# Patient Record
Sex: Male | Born: 1995 | Race: White | Hispanic: No | Marital: Single | Smoking: Former smoker
Health system: Southern US, Community
[De-identification: ages and names within clinical notes are randomized; demographics above are authoritative.]

## PROBLEM LIST (undated history)

## (undated) ENCOUNTER — Emergency Department (HOSPITAL_COMMUNITY): Disposition: A | Discharge status: Home / Self Care | Payer: Self-pay

## (undated) DIAGNOSIS — F419 Anxiety disorder, unspecified: Secondary | ICD-10-CM

## (undated) DIAGNOSIS — K219 Gastro-esophageal reflux disease without esophagitis: Secondary | ICD-10-CM

## (undated) DIAGNOSIS — T7840XA Allergy, unspecified, initial encounter: Secondary | ICD-10-CM

## (undated) HISTORY — DX: Gastro-esophageal reflux disease without esophagitis: K21.9

## (undated) HISTORY — PX: SHOULDER SURGERY: SHX246

## (undated) HISTORY — PX: TONSILLECTOMY: SUR1361

## (undated) HISTORY — DX: Anxiety disorder, unspecified: F41.9

## (undated) HISTORY — DX: Allergy, unspecified, initial encounter: T78.40XA

---
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
## 2014-11-20 ENCOUNTER — Ambulatory Visit: Payer: Self-pay

## 2014-11-20 ENCOUNTER — Other Ambulatory Visit: Payer: Self-pay | Admitting: Occupational Medicine

## 2014-11-20 DIAGNOSIS — M79644 Pain in right finger(s): Secondary | ICD-10-CM

## 2017-03-06 ENCOUNTER — Emergency Department (HOSPITAL_COMMUNITY)
Admission: EM | Admit: 2017-03-06 | Discharge: 2017-03-06 | Disposition: A | Discharge status: Home / Self Care | Payer: 59 | Attending: Emergency Medicine | Admitting: Emergency Medicine

## 2017-03-06 ENCOUNTER — Encounter (HOSPITAL_COMMUNITY): Payer: Self-pay | Admitting: Emergency Medicine

## 2017-03-06 ENCOUNTER — Emergency Department (HOSPITAL_COMMUNITY): Payer: 59

## 2017-03-06 DIAGNOSIS — J189 Pneumonia, unspecified organism: Secondary | ICD-10-CM | POA: Insufficient documentation

## 2017-03-06 DIAGNOSIS — R112 Nausea with vomiting, unspecified: Secondary | ICD-10-CM | POA: Insufficient documentation

## 2017-03-06 DIAGNOSIS — R197 Diarrhea, unspecified: Secondary | ICD-10-CM | POA: Diagnosis not present

## 2017-03-06 DIAGNOSIS — F17210 Nicotine dependence, cigarettes, uncomplicated: Secondary | ICD-10-CM | POA: Insufficient documentation

## 2017-03-06 LAB — COMPREHENSIVE METABOLIC PANEL
ALT: 38 U/L (ref 17–63)
ANION GAP: 10 (ref 5–15)
AST: 44 U/L — ABNORMAL HIGH (ref 15–41)
Albumin: 4 g/dL (ref 3.5–5.0)
Alkaline Phosphatase: 44 U/L (ref 38–126)
BUN: 9 mg/dL (ref 6–20)
CHLORIDE: 99 mmol/L — AB (ref 101–111)
CO2: 24 mmol/L (ref 22–32)
CREATININE: 1.01 mg/dL (ref 0.61–1.24)
Calcium: 8.8 mg/dL — ABNORMAL LOW (ref 8.9–10.3)
Glucose, Bld: 101 mg/dL — ABNORMAL HIGH (ref 65–99)
POTASSIUM: 2.9 mmol/L — AB (ref 3.5–5.1)
SODIUM: 133 mmol/L — AB (ref 135–145)
Total Bilirubin: 0.9 mg/dL (ref 0.3–1.2)
Total Protein: 7 g/dL (ref 6.5–8.1)

## 2017-03-06 LAB — CBC WITH DIFFERENTIAL/PLATELET
Basophils Absolute: 0 10*3/uL (ref 0.0–0.1)
Basophils Relative: 0 %
EOS PCT: 0 %
Eosinophils Absolute: 0 10*3/uL (ref 0.0–0.7)
HEMATOCRIT: 40.1 % (ref 39.0–52.0)
HEMOGLOBIN: 14.5 g/dL (ref 13.0–17.0)
LYMPHS ABS: 1.7 10*3/uL (ref 0.7–4.0)
LYMPHS PCT: 15 %
MCH: 29.9 pg (ref 26.0–34.0)
MCHC: 36.2 g/dL — ABNORMAL HIGH (ref 30.0–36.0)
MCV: 82.7 fL (ref 78.0–100.0)
Monocytes Absolute: 0.4 10*3/uL (ref 0.1–1.0)
Monocytes Relative: 4 %
NEUTROS ABS: 9.3 10*3/uL — AB (ref 1.7–7.7)
NEUTROS PCT: 81 %
PLATELETS: 203 10*3/uL (ref 150–400)
RBC: 4.85 MIL/uL (ref 4.22–5.81)
RDW: 12.6 % (ref 11.5–15.5)
WBC: 11.5 10*3/uL — AB (ref 4.0–10.5)

## 2017-03-06 LAB — LIPASE, BLOOD: LIPASE: 27 U/L (ref 11–51)

## 2017-03-06 LAB — MAGNESIUM: Magnesium: 1.6 mg/dL — ABNORMAL LOW (ref 1.7–2.4)

## 2017-03-06 LAB — RAPID STREP SCREEN (MED CTR MEBANE ONLY): Streptococcus, Group A Screen (Direct): NEGATIVE

## 2017-03-06 MED ORDER — ONDANSETRON HCL 4 MG PO TABS: 4.0000 mg | ORAL_TABLET | Freq: Four times a day (QID) | ORAL | 0 refills | Status: DC

## 2017-03-06 MED ORDER — AZITHROMYCIN 250 MG PO TABS
500.0000 mg | ORAL_TABLET | Freq: Once | ORAL | Status: AC
  Administered 2017-03-06: 500 mg via ORAL
  Filled 2017-03-06: qty 2

## 2017-03-06 MED ORDER — SODIUM CHLORIDE 0.9 % IV BOLUS (SEPSIS)
1000.0000 mL | Freq: Once | INTRAVENOUS | Status: AC
  Administered 2017-03-06: 1000 mL via INTRAVENOUS

## 2017-03-06 MED ORDER — POTASSIUM CHLORIDE 10 MEQ/100ML IV SOLN
10.0000 meq | Freq: Once | INTRAVENOUS | Status: AC
  Administered 2017-03-06: 10 meq via INTRAVENOUS
  Filled 2017-03-06: qty 100

## 2017-03-06 MED ORDER — POTASSIUM CHLORIDE CRYS ER 20 MEQ PO TBCR: 20.0000 meq | EXTENDED_RELEASE_TABLET | Freq: Two times a day (BID) | ORAL | 0 refills | Status: DC

## 2017-03-06 MED ORDER — AZITHROMYCIN 250 MG PO TABS: 250.0000 mg | ORAL_TABLET | Freq: Every day | ORAL | 0 refills | Status: AC

## 2017-03-06 MED ORDER — ONDANSETRON HCL 4 MG/2ML IJ SOLN
4.0000 mg | Freq: Once | INTRAMUSCULAR | Status: AC
  Administered 2017-03-06: 4 mg via INTRAVENOUS
  Filled 2017-03-06: qty 2

## 2017-03-06 MED ORDER — SUCRALFATE 1 G PO TABS: 1.0000 g | ORAL_TABLET | Freq: Three times a day (TID) | ORAL | 0 refills | Status: DC

## 2017-03-06 MED ORDER — PANTOPRAZOLE SODIUM 20 MG PO TBEC: 20.0000 mg | DELAYED_RELEASE_TABLET | Freq: Every day | ORAL | 0 refills | Status: DC

## 2017-03-06 MED ORDER — MAGNESIUM SULFATE IN D5W 1-5 GM/100ML-% IV SOLN
1.0000 g | Freq: Once | INTRAVENOUS | Status: AC
  Administered 2017-03-06: 1 g via INTRAVENOUS
  Filled 2017-03-06: qty 100

## 2017-03-06 MED ORDER — POTASSIUM CHLORIDE CRYS ER 20 MEQ PO TBCR
40.0000 meq | EXTENDED_RELEASE_TABLET | Freq: Once | ORAL | Status: AC
Start: 2017-03-06 — End: 2017-03-06
  Administered 2017-03-06: 40 meq via ORAL
  Filled 2017-03-06: qty 2

## 2017-03-06 NOTE — Discharge Instructions (Signed)
Please read and follow all provided instructions.  Your diagnoses today include:  1. Community acquired pneumonia, unspecified laterality   2. Non-intractable vomiting with nausea, unspecified vomiting type   3. Diarrhea, unspecified type     Tests performed today include: Blood counts and electrolytes Blood tests to check liver and kidney function Blood tests to check pancreas function Chest x-ray.  This showed you had a community-acquired pneumonia in both upper lobes. Vital signs. See below for your results today.   Medications prescribed:   Take any prescribed medications only as directed.  Please take all of your antibiotics until finished.   You may develop abdominal discomfort or nausea from the antibiotic. If this occurs, you may take it with food. Some patients also get diarrhea with antibiotics. You may help offset this with probiotics which you can buy or get in yogurt. Do not eat or take the probiotics until 2 hours after your antibiotic.  Some people develop allergies to antibiotics. Symptoms of antibiotic allergy can be mild and include a flat rash and itching. They can also be more serious and include:  ?Hives - Hives are raised, red patches of skin that are usually very itchy.  ?Lip or tongue swelling  ?Trouble swallowing or breathing  ?Blistering of the skin or mouth.  If you have any of these serious symptoms, please seek emergency medical care immediately.  I prescribed a proton pump inhibitor to help reduce acid in your stomach.  This will help treat any irritation your stomach.  I also prescribed Carafate, this is a medication that carries a coating of your stomach to prevent irritation.  Zofran is for nausea.  You may take 1 pill every 6 hours as needed.  Please ensure that you take all of your potassium to bring your potassium levels up.   Home care instructions:  Follow any educational materials contained in this packet.  Your abdominal pain, nausea,  vomiting, and diarrhea may be caused by a viral gastroenteritis also called 'stomach flu'. You should rest for the next several days. Keep drinking plenty of fluids and use the medicine for nausea as directed.   Drink clear liquids for the next 24 hours and introduce solid foods slowly after 24 hours using the b.r.a.t. diet (Bananas, Rice, Applesauce, Toast, Yogurt).    On testing today, your potassium level was 2.9 which is slightly below normal. I suspect this is due to vomiting. Increasing potassium in your diet should be sufficient to make sure you are getting enough potassium. Below is a list of good sources and servings high in potassium (in order from greatest to least):  -1 cup cooked acorn squash -1 baked potato with skin -1 cup cooked spinach -1 cup cooked lentils -1 cup cooked kidney beans -Watermelon - cup raisins -1 cup plain yogurt -1 cup orange juice, frozen -Banana -1 cup 1% low-fat milk -1 cup cooked broccoli   Follow-up instructions: Please follow-up with your primary care provider in the next 2 days for further evaluation of your symptoms. If you are not feeling better in 48 hours you may have a condition that is more serious and you need re-evaluation.   You should receive a repeat chest x-ray in 4 weeks by establishing primary care.  I provided some resources in this paperwork to get a primary care provider.  Return instructions:  SEEK IMMEDIATE MEDICAL ATTENTION IF: If you have pain that does not go away or becomes severe  A temperature above 101F develops  Repeated  vomiting occurs (multiple episodes)  If you have pain that becomes localized to portions of the abdomen. The right side could possibly be appendicitis. In an adult, the left lower portion of the abdomen could be colitis or diverticulitis.  Blood is being passed in stools or vomit (bright red or black tarry stools)  You develop chest pain, difficulty breathing, dizziness or fainting, or become  confused, poorly responsive, or inconsolable (young children) If you have any other emergent concerns regarding your health Please return for any increasing chest pain or shortness of breath.  Additional Information: Abdominal (belly) pain can be caused by many things. Your caregiver performed an examination and possibly ordered blood/urine tests and imaging (CT scan, x-rays, ultrasound). Many cases can be observed and treated at home after initial evaluation in the emergency department. Even though you are being discharged home, abdominal pain can be unpredictable. Therefore, you need a repeated exam if your pain does not resolve, returns, or worsens. Most patients with abdominal pain don't have to be admitted to the hospital or have surgery, but serious problems like appendicitis and gallbladder attacks can start out as nonspecific pain. Many abdominal conditions cannot be diagnosed in one visit, so follow-up evaluations are very important.  To find a primary care or specialty doctor please call [phone] or [phone] to access "[facility] Find a Doctor Service."  You may also go on the [facility] website at [email or web address]  There are also multiple [facility] and [facility] practices throughout the [place] that are frequently accepting new patients. You may find a clinic that is close to your home and contact them.  [facility] - [address] [place] [phone]  [facility] in [place] (also locations in [place] and [place]) - [address] [place] [phone]  [facility] - [address] [place] [phone]    Your vital signs today were: BP 138/77 (BP Location: Right Arm)    Pulse 96    Temp 99.5 F (37.5 C) (Oral)    Resp 16    SpO2 96%  If your blood pressure (bp) was elevated above 135/85 this visit, please have this repeated by  your doctor within one month. --------------

## 2017-03-06 NOTE — ED Provider Notes (Signed)
[facility]-EMERGENCY DEPT Provider Note   CSN: [id number] Arrival date & time: 03/06/17  1002     History   Chief Complaint Chief Complaint  Patient presents with  . Fatigue  . flu like symptoms    HPI Harold Hudson is a 22 y.o. male.  HPI   Patient is a 22 year old male with no significant past medical history and no abdominal surgical history presenting for nausea, vomiting, diarrhea.  Patient reports his symptoms have occurred for the past 4-5 days, and he is unable to keep anything by mouth down.  Patient reports that he will vomit up to 10 times a day, as well as have diarrheal episodes up to 10 times a day.  Patient also reports an increased cough.  Patient reports that he has seen brown tinged sputum and emesis, as well as some bright red blood mixed in his emesis.  No melena or hematochezia.  Patient reports he has felt chilled, however is not recorded any fevers at home.  Patient reports that he has some diffuse discomfort to the abdomen.  Additionally, patient reports that he has a sore throat that has been swallowing painful.  No obstructive swallowing, or change in voice quality.  Patient has been taking Mucinex as well as Tylenol for symptoms.  Patient denies NSAID use.  Patient reports that he does drink alcohol heavily weekly.  Patient reports that on weekdays, he can drink up to 3 beers in an evening, approximately 3x per week, and on weekends he may have up to 8 beers.  Patient does smoke cigarettes.  History reviewed. No pertinent past medical history.  There are no active problems to display for this patient.   History reviewed. No pertinent surgical history.     Home Medications    Prior to Admission medications   Not on File    Family History History reviewed. No pertinent family history.  Social History Social History   Tobacco Use  . Smoking status: Current Every Day Smoker  . Smokeless tobacco: Never Used  Substance Use  Topics  . Alcohol use: Yes  . Drug use: Yes    Types: Marijuana     Allergies   Patient has no known allergies.   Review of Systems Review of Systems  Constitutional: Positive for chills and fever.  HENT: Positive for congestion, rhinorrhea and sore throat. Negative for voice change.   Eyes: Negative for visual disturbance.  Respiratory: Positive for cough.   Cardiovascular: Negative for chest pain.  Gastrointestinal: Positive for abdominal pain. Negative for blood in stool.  Genitourinary: Negative for dysuria.  Musculoskeletal: Negative for neck pain and neck stiffness.  Skin: Negative for rash.  Allergic/Immunologic: Negative for immunocompromised state.  Neurological: Positive for headaches.     Physical Exam Updated Vital Signs BP 139/83 (BP Location: Right Arm)   Pulse 100   Temp 100.1 F (37.8 C) (Oral)   Resp 18   SpO2 100%   Physical Exam  Constitutional: He appears well-developed and well-nourished. No distress.  HENT:  Head: Normocephalic and atraumatic.  Mouth/Throat: Oropharynx is clear and moist.  Normal phonation. No muffled voice sounds. Patient swallows secretions without difficulty. Dentition normal. No lesions of tongue or buccal mucosa. Uvula midline. No asymmetric swelling of the posterior pharynx. No erythema of posterior pharynx.  Tonsils surgically removed.  No lingual swelling. No induration inferior to tongue. No submandibular tenderness, swelling, or induration.  Tissues of the neck supple. No cervical lymphadenopathy.   Eyes: Conjunctivae  and EOM are normal. Pupils are equal, round, and reactive to light.  Neck: Normal range of motion. Neck supple.  No neck stiffness.  No meningismal signs.  Cardiovascular: Normal rate, regular rhythm, S1 normal and S2 normal.  No murmur heard. Patient non tachycardic.  Intact, 2+ radial pulse.  Pulmonary/Chest: Effort normal and breath sounds normal. He has no wheezes.  Coarse lung sounds on right lower  lung field.  Abdominal: Soft. He exhibits no distension. There is no tenderness. There is no guarding.  Abdomen benign.  Musculoskeletal: Normal range of motion. He exhibits no edema or deformity.  Lymphadenopathy:    He has no cervical adenopathy.  Neurological: He is alert.  Cranial nerves grossly intact. Patient was extremities symmetrically and with good coordination.  Skin: Skin is warm and dry. No rash noted. No erythema.  Psychiatric: He has a normal mood and affect. His behavior is normal. Judgment and thought content normal.  Nursing note and vitals reviewed.    ED Treatments / Results  Labs (all labs ordered are listed, but only abnormal results are displayed) Labs Reviewed  RAPID STREP SCREEN (NOT AT [facility])  COMPREHENSIVE METABOLIC PANEL  LIPASE, BLOOD  CBC WITH DIFFERENTIAL/PLATELET    EKG  EKG Interpretation None       Radiology No results found.  Procedures Procedures (including critical care time)  Medications Ordered in ED Medications  sodium chloride 0.9 % bolus 1,000 mL (not administered)  ondansetron (ZOFRAN) injection 4 mg (not administered)     Initial Impression / Assessment and Plan / ED Course  I have reviewed the triage vital signs and the nursing notes.  Pertinent labs & imaging results that were available during my care of the patient were reviewed by me and considered in my medical decision making (see chart for details).     Final Clinical Impressions(s) / ED Diagnoses   Final diagnoses:  Community acquired pneumonia, unspecified laterality  Non-intractable vomiting with nausea, unspecified vomiting type  Diarrhea, unspecified type   Patient is nontoxic-appearing, temperature 100.1, and in no acute distress at this time.  Patient is having ongoing nausea as well as emesis and diarrhea.  No emesis observed in the room.  Will initiate CMP, lipase, CBC with differential, chest x-ray, and rapid strep.  Will initiate fluid repletion  and antiemetics and re-eval.  Will assess stability of possible GI bleeding after lab work.   On subsequent reevaluation, patient was asymptomatic of nausea after Zofran.  Patient eating sandwich and tolerating p.o. intake.  I discussed the results of testing with patient.  Patient to receive 1 round of IV potassium due to potassium of 2.9 as well as 40 mEq orally.  Additionally, patient will receive 1 g of magnesium.  EKG ordered due to hypo-kalemia, and without evidence of QT prolongation.  As patient is currently tolerating p.o. intake, expect patient to tolerate outpatient management of community-acquired pneumonia and nausea and vomiting. Case was discussed with Dr. Arby Barrette who is in agreement with plan of care.  Orthostatic vital signs without evidence of significant orthostasis.  Patient is hemodynamically stable and BUN is not elevated.  I do not suspect upper GI bleed.  Chest x-ray was reviewed with Dr. Mancel Bale.  No evidence of cavitary lesions c/w Staph aureus, or concerning features not amenable to outpatient therapy with Zithromax.  Patient clinically well and nontoxic appearing.  Patient discharged with continuation of course of Zithromax, potassium replacement, antiemetics, as well as PPI and Carafate therapy.  Unclear  if patient had blood-tinged emesis.  I recommended follow-up with gastroenterology.  I also provided patient recommendations for primary care, and reviewed this with both patient and his father.  Return precautions were given for any worsening chest pain, shortness of breath, hematemesis or hemoptysis, or intractable nausea or vomiting.  Patient and his father in understanding and agree with the plan of care.  ED Discharge Orders    None       Delia Chimes 03/07/17 0303    Arby Barrette, MD 03/18/17 1407

## 2017-03-06 NOTE — ED Triage Notes (Addendum)
Pt c/o flu like symptoms since Jan. 1, 2019. Pt states worsening over the past several days. Pt has taken Tylenol 500mg  and Mucinex this morning. Pt states he has not been able to eat due to nausea.

## 2017-03-06 NOTE — ED Notes (Signed)
Pt ambulatory and independent at discharge.  Verbalized understanding of discharge instructions 

## 2017-03-09 LAB — CULTURE, GROUP A STREP (THRC)

## 2019-03-15 IMAGING — CR DG CHEST 2V
2 series · 2 of 2 positions shown · non-contrast
Comparison: None.

CLINICAL DATA: Hemoptysis, fever for 2 days

EXAM:
CHEST  2 VIEW

[w chest pa]
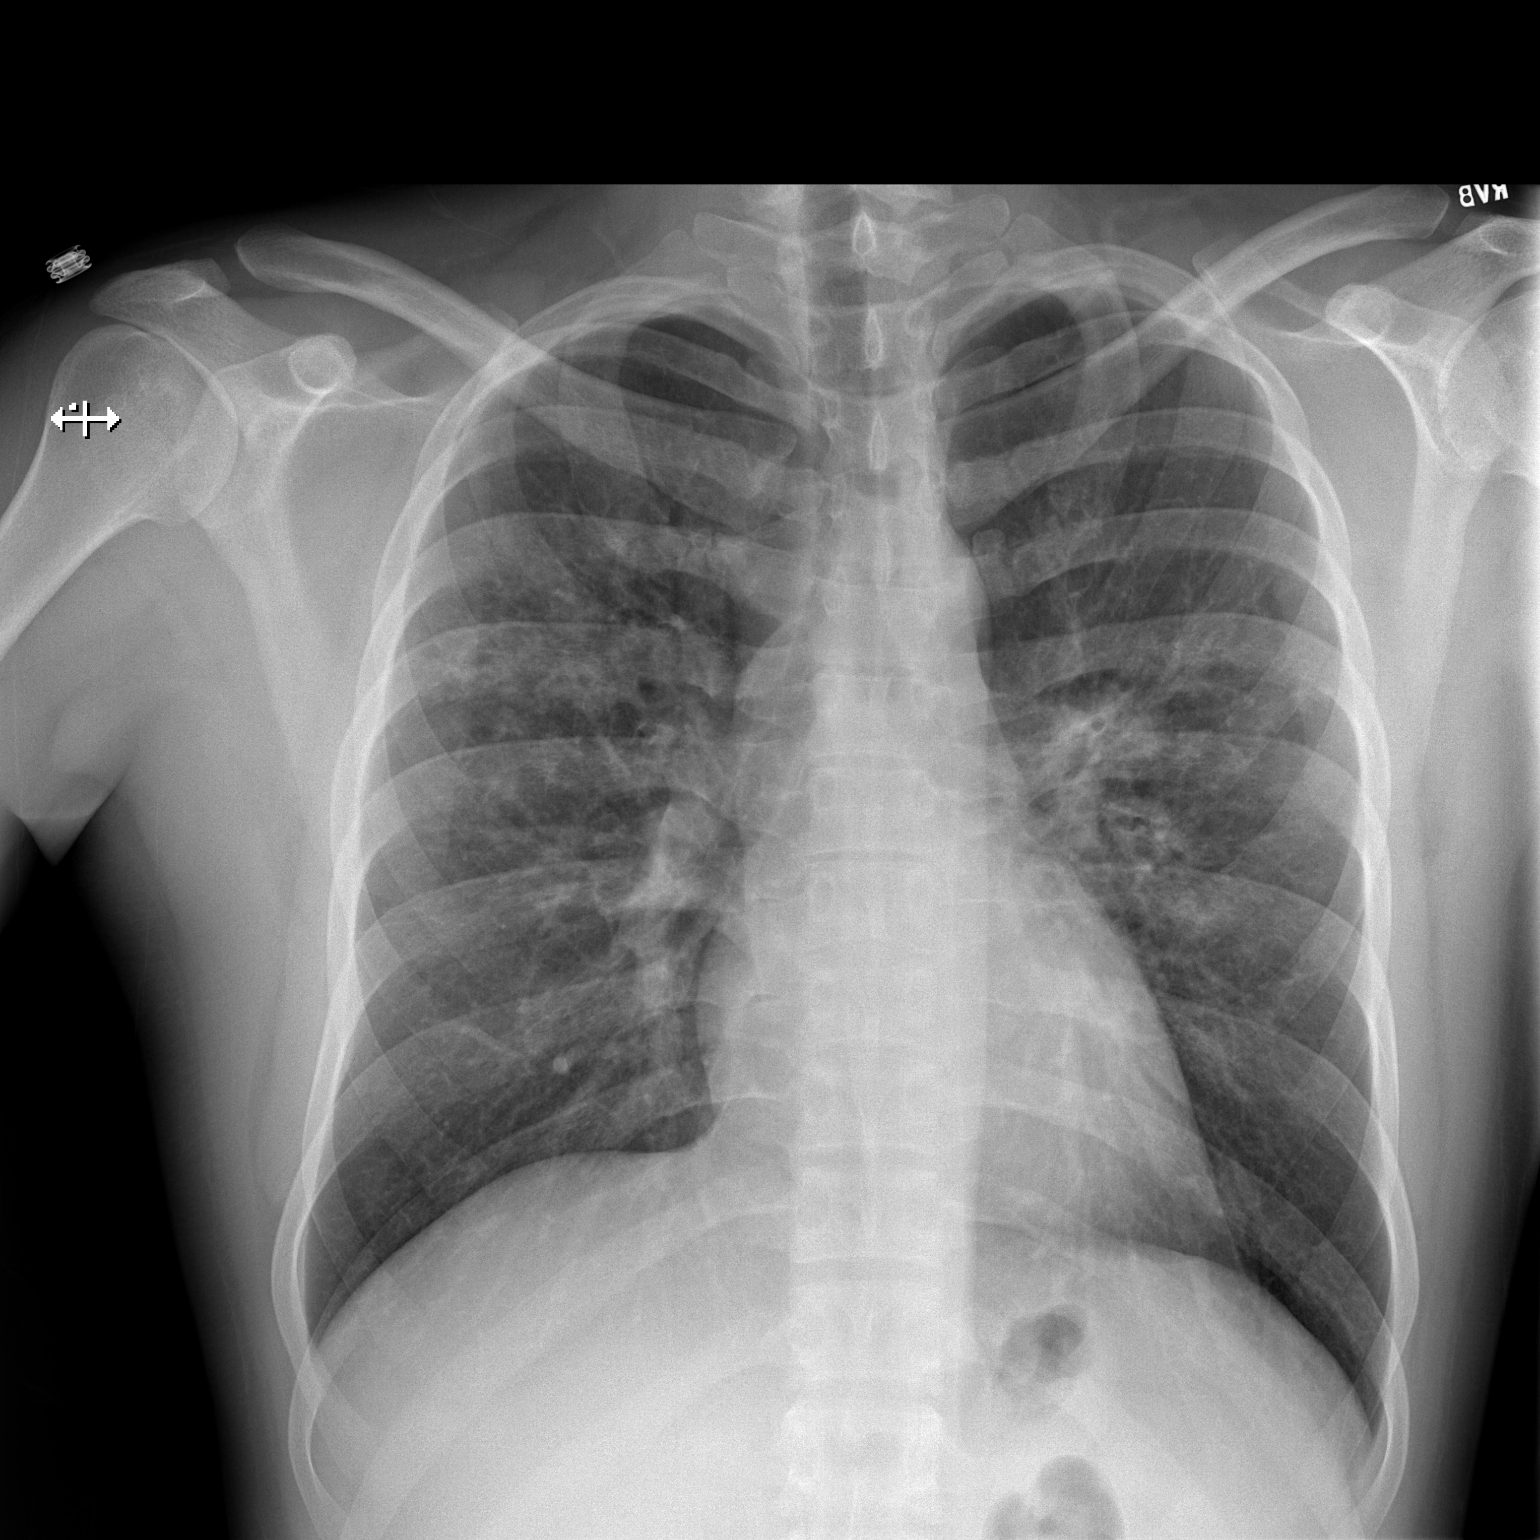

[w chest lat]
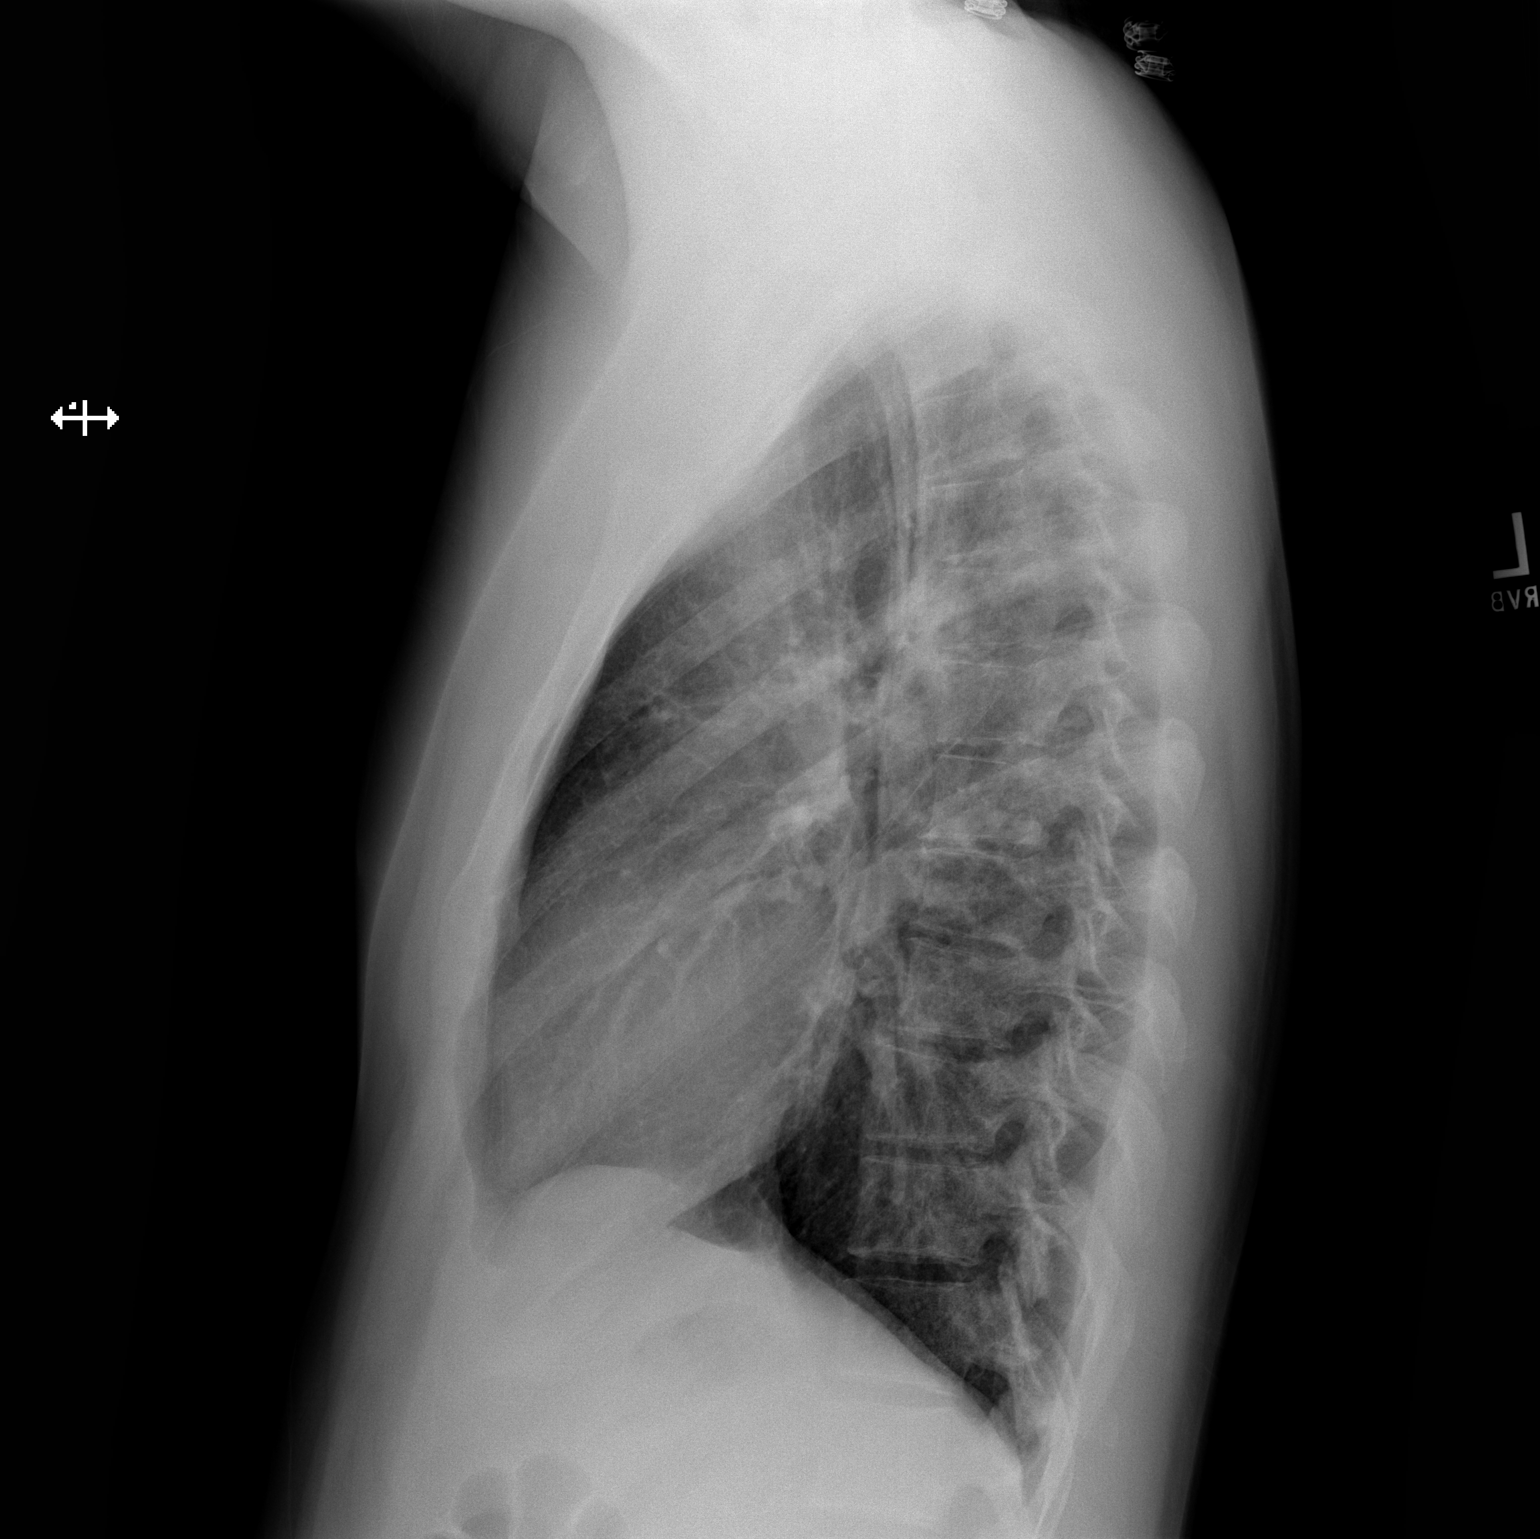

[2 of 2 positions shown; findings below may reference images not displayed]

FINDINGS: There is hazy bilateral perihilar upper lobe airspace disease most
concerning for pneumonia. There is no pleural effusion or
pneumothorax. The heart and mediastinal contours are unremarkable.

The osseous structures are unremarkable.
IMPRESSION: Bilateral upper lobe pneumonia.

## 2020-12-24 ENCOUNTER — Ambulatory Visit
Admission: EM | Admit: 2020-12-24 | Discharge: 2020-12-24 | Disposition: A | Discharge status: Home / Self Care | Payer: 59 | Attending: Emergency Medicine | Admitting: Emergency Medicine

## 2020-12-24 ENCOUNTER — Other Ambulatory Visit: Payer: Self-pay

## 2020-12-24 ENCOUNTER — Telehealth: Payer: Self-pay | Admitting: Emergency Medicine

## 2020-12-24 DIAGNOSIS — K2100 Gastro-esophageal reflux disease with esophagitis, without bleeding: Secondary | ICD-10-CM | POA: Diagnosis not present

## 2020-12-24 DIAGNOSIS — F411 Generalized anxiety disorder: Secondary | ICD-10-CM

## 2020-12-24 DIAGNOSIS — F410 Panic disorder [episodic paroxysmal anxiety] without agoraphobia: Secondary | ICD-10-CM

## 2020-12-24 MED ORDER — SUCRALFATE 1 G PO TABS: ORAL_TABLET | ORAL | 0 refills | Status: DC

## 2020-12-24 MED ORDER — PANTOPRAZOLE SODIUM 40 MG PO TBEC: 40.0000 mg | DELAYED_RELEASE_TABLET | Freq: Every day | ORAL | 0 refills | Status: DC

## 2020-12-24 MED ORDER — HYDROXYZINE HCL 25 MG PO TABS: 25.0000 mg | ORAL_TABLET | Freq: Four times a day (QID) | ORAL | 0 refills | Status: DC

## 2020-12-24 NOTE — Discharge Instructions (Signed)
Your chest pain and reports of increased frequency of heartburn recently are concerning for peptic ulcer versus H. pylori infection.  I have renewed previous prescriptions for pantoprazole and sulcal fate, I strongly encourage you to follow-up with your primary care provider for H. pylori testing and possible treatment if needed.  For your anxiety, I recommend that you begin taking a medication called Vistaril 25 mg, this can be taken 4 times daily as needed.  Please also follow-up your primary care physician regarding your anxiety to discuss possible daily maintenance therapy to prevent panic attacks.

## 2020-12-24 NOTE — ED Provider Notes (Addendum)
[facility]    CSN: [id number] Arrival date & time: 12/24/20  1153      History   Chief Complaint Chief Complaint  Patient presents with   tightness in chest     HPI Harold Hudson is a 25 y.o. male.   Patient reports a history of heartburn.  Patient also complains of a 78-month history of episodes of chest tightness accompanied by panic attacks.  Patient states he was consuming alcohol last week and began to have the worst chest tightness he is ever had, called EMS for evaluation, was advised that they felt he was having a panic attack after their initial assessment.  Patient states he believes they are correct because as they were talking to him in the ambulance he began to feel better.  Patient states he uses vapes.  Patient states he called his primary care provider who advised him to go to urgent care to obtain a prescription for some antianxiety medication.  Patient is also asking for a doctor's note to return to work 2 days from now.  EMR reviewed, patient has a history of being prescribed pantoprazole and sucralfate 3 years ago, does not recall taking this or why it was discontinued.  The history is provided by the patient.   History reviewed. No pertinent past medical history.  There are no problems to display for this patient.   Past Surgical History:  Procedure Laterality Date   TONSILLECTOMY         Home Medications    Prior to Admission medications   Medication Sig Start Date End Date Taking? Authorizing Provider  acetaminophen (TYLENOL) 500 MG tablet Take 1,000 mg by mouth daily as needed (pain/fever/sore throat).    [provider]  ondansetron (ZOFRAN) 4 MG tablet Take 1 tablet (4 mg total) by mouth every 6 (six) hours. 03/06/17   Aviva Kluver B, PA-C  pantoprazole (PROTONIX) 40 MG tablet Take 1 tablet (40 mg total) by mouth daily. Separate dose of pantoprazole and sulcal fate by 2 hours. 12/24/20 03/24/21  Theadora Rama Scales, PA-C   Phenylephrine-APAP-guaiFENesin [phone] MG/20ML LIQD Take 20 mLs by mouth 4 (four) times daily as needed (cough, cold).    [provider]  potassium chloride SA (K-DUR,KLOR-CON) 20 MEQ tablet Take 1 tablet (20 mEq total) by mouth 2 (two) times daily for 5 days. 03/06/17 03/11/17  Aviva Kluver B, PA-C  sucralfate (CARAFATE) 1 g tablet Take 1 tablet 4 times daily, take 30 minutes prior to meals and prior to bedtime.  Separate doses of sucralfate from daily dose of Protonix by 2 hours. 12/24/20   Theadora Rama Scales, PA-C    Family History History reviewed. No pertinent family history.  Social History Social History   Tobacco Use   Smoking status: Former    Types: Cigarettes    Quit date: 2021    Years since quitting: 1.8   Smokeless tobacco: Never  Vaping Use   Vaping Use: Every day  Substance Use Topics   Alcohol use: Yes   Drug use: Yes    Types: Marijuana     Allergies   Patient has no known allergies.   Review of Systems Review of Systems Pertinent findings noted in history of present illness.    Physical Exam Triage Vital Signs ED Triage Vitals [12/24/20 1356]  Enc Vitals Group     BP 122/79     Pulse Rate 67     Resp 18     Temp 98.1 F (  36.7 C)     Temp Source Oral     SpO2 98 %     Weight      Height      Head Circumference      Peak Flow      Pain Score      Pain Loc      Pain Edu?      Excl. in GC?    No data found.  Updated Vital Signs BP 122/79 (BP Location: Left Arm)   Pulse 67   Temp 98.1 F (36.7 C) (Oral)   Resp 18   SpO2 98%   Visual Acuity Right Eye Distance:   Left Eye Distance:   Bilateral Distance:    Right Eye Near:   Left Eye Near:    Bilateral Near:     Physical Exam Vitals and nursing note reviewed.  Constitutional:      General: He is not in acute distress.    Appearance: Normal appearance. He is not ill-appearing.  HENT:     Head: Normocephalic and atraumatic.  Eyes:     General: Lids are  normal.        Right eye: No discharge.        Left eye: No discharge.     Extraocular Movements: Extraocular movements intact.     Conjunctiva/sclera: Conjunctivae normal.     Right eye: Right conjunctiva is not injected.     Left eye: Left conjunctiva is not injected.  Neck:     Trachea: Trachea and phonation normal.  Cardiovascular:     Rate and Rhythm: Normal rate and regular rhythm.     Pulses: Normal pulses.     Heart sounds: Normal heart sounds. No murmur heard.   No friction rub. No gallop.  Pulmonary:     Effort: Pulmonary effort is normal. No accessory muscle usage, prolonged expiration or respiratory distress.     Breath sounds: Normal breath sounds. No stridor, decreased air movement or transmitted upper airway sounds. No decreased breath sounds, wheezing, rhonchi or rales.  Chest:     Chest wall: No tenderness.  Abdominal:     General: Abdomen is flat. Bowel sounds are normal. There is no distension.     Palpations: Abdomen is soft.     Tenderness: There is abdominal tenderness in the epigastric area. There is no right CVA tenderness or left CVA tenderness.     Hernia: No hernia is present.  Musculoskeletal:        General: Normal range of motion.     Cervical back: Normal range of motion and neck supple. Normal range of motion.  Lymphadenopathy:     Cervical: No cervical adenopathy.  Skin:    General: Skin is warm and dry.     Findings: No erythema or rash.  Neurological:     General: No focal deficit present.     Mental Status: He is alert and oriented to person, place, and time.  Psychiatric:        Mood and Affect: Mood normal.        Behavior: Behavior normal.     UC Treatments / Results  Labs (all labs ordered are listed, but only abnormal results are displayed) Labs Reviewed - No data to display  EKG   Radiology No results found.  Procedures Procedures (including critical care time)  Medications Ordered in UC Medications - No data to  display  Initial Impression / Assessment and Plan / UC Course  I have  reviewed the triage vital signs and the nursing notes.  Pertinent labs & imaging results that were available during my care of the patient were reviewed by me and considered in my medical decision making (see chart for details).     Patient advised that while his his symptoms may be related to having a panic attack, given his history of heartburn and the acute onset of discomfort with alcohol consumption, I suspect that he is experiencing some acute gastritis with possible peptic ulcer disease.  Recommend that he resume pantoprazole with sucralfate and make an appointment to follow-up with his primary care physician for possible H. pylori testing.  I have also advised him to begin hydroxyzine 4 times daily as needed for panic attacks.  Patient verbalized understanding and agreement of plan as discussed.  All questions were addressed during visit.  Please see discharge instructions below for further details of plan.  Final Clinical Impressions(s) / UC Diagnoses   Final diagnoses:  Gastroesophageal reflux disease with esophagitis, unspecified whether hemorrhage  Panic attack  Generalized anxiety disorder     Discharge Instructions      Your chest pain and reports of increased frequency of heartburn recently are concerning for peptic ulcer versus H. pylori infection.  I have renewed previous prescriptions for pantoprazole and sulcal fate, I strongly encourage you to follow-up with your primary care provider for H. pylori testing and possible treatment if needed.  For your anxiety, I recommend that you begin taking a medication called Vistaril 25 mg, this can be taken 4 times daily as needed.  Please also follow-up your primary care physician regarding your anxiety to discuss possible daily maintenance therapy to prevent panic attacks.     ED Prescriptions     Medication Sig Dispense Auth. Provider   pantoprazole  (PROTONIX) 40 MG tablet Take 1 tablet (40 mg total) by mouth daily. Separate dose of pantoprazole and sulcal fate by 2 hours. 90 tablet Theadora Rama Scales, PA-C   sucralfate (CARAFATE) 1 g tablet Take 1 tablet 4 times daily, take 30 minutes prior to meals and prior to bedtime.  Separate doses of sucralfate from daily dose of Protonix by 2 hours. 40 tablet Theadora Rama Scales, PA-C      PDMP not reviewed this encounter.   Theadora Rama Scales, PA-C 12/24/20 1738    Theadora Rama Scales, PA-C 12/24/20 [phone]

## 2020-12-24 NOTE — Telephone Encounter (Signed)
Prescription was not ordered at discharge

## 2020-12-24 NOTE — ED Triage Notes (Signed)
3-35mo h/o intermittent episodes of chest tightness. Pt reports after consuming alcohol last weekend, he experienced "the worst" chest tightness with tingling in his extremities. He was worked up by the paramedics who thought that the patient may have been experiencing a panic attack. He was not seen at the ED. Pt vapes.  He would like to discuss anxiety meds. Pt needs a doctors note.

## 2021-05-07 ENCOUNTER — Encounter: Payer: Self-pay | Admitting: Family

## 2021-05-07 ENCOUNTER — Ambulatory Visit: Payer: 59 | Admitting: Family

## 2021-05-07 VITALS — BP 116/74 | HR 75 | Temp 98.0°F | Ht 70.0 in | Wt 171.2 lb

## 2021-05-07 DIAGNOSIS — K2100 Gastro-esophageal reflux disease with esophagitis, without bleeding: Secondary | ICD-10-CM | POA: Diagnosis not present

## 2021-05-07 MED ORDER — PANTOPRAZOLE SODIUM 20 MG PO TBEC: 20.0000 mg | DELAYED_RELEASE_TABLET | Freq: Two times a day (BID) | ORAL | 0 refills | Status: AC

## 2021-05-07 NOTE — Patient Instructions (Addendum)
Welcome to [facility]! It was very nice to meet you today! ? ?I have sent a refill of the Protonix to your pharmacy. As discussed, This is a lower dose than what you were taking. Take 1 pill twice a day for 2 weeks, then reduce to 1 pill daily and remain on this dose.  ?Avoid high acidic foods including caffeine and keep your stress to a minimum as this can worsen symptoms also. If your stress is hard to manage let me know so we can discuss options. ? ? ? ? ?PLEASE NOTE: ? ?If you had any lab tests please let us know if you have not heard back within a few days. You may see your results on MyChart before we have a chance to review them but we will give you a call once they are reviewed by [place]. If we ordered any referrals today, please let us know if you have not heard from their office within the next week.  ? ?Please try these tips to maintain a healthy lifestyle: ? ?Eat most of your calories during the day when you are active. Eliminate processed foods including packaged sweets (pies, cakes, cookies), reduce intake of potatoes, white bread, white pasta, and white rice. Look for whole grain options, oat flour or almond flour. ? ?Each meal should contain half fruits/vegetables, one quarter protein, and one quarter carbs (no bigger than a computer mouse). ? ?Cut down on sweet beverages. This includes juice, soda, and sweet tea. Also watch fruit intake, though this is a healthier sweet option, it still contains natural sugar! Limit to 3 servings daily. ? ?Drink at least 1 glass of water with each meal and aim for at least 8 glasses per day ? ?Exercise at least 150 minutes every week.   ?

## 2021-05-07 NOTE — Progress Notes (Signed)
? ?New Patient Office Visit ? ?Subjective:  ?Patient ID: Harold Hudson, male    DOB: [birth year]  Age: 26 y.o. MRN: [id number] ? ?CC:  ?Chief Complaint  ?Patient presents with  ? Heartburn  ? ?HPI ?Harold Hudson presents for establishing care and to discuss one problem. ? ?GERD: Patient is presenting for follow up. This has been associated with heartburn, hoarseness, midespigastric pain, regurgitation of undigested food, and unexpected weight loss.  He denies dysphagia, hematemesis, need to clear throat frequently, and shortness of breath. Symptoms have been present for  months. He denies dysphagia.  He  has mild weight loss . He denies melena, hematochezia, hematemesis, and coffee ground emesis. Medical therapy in the past has included proton pump inhibitors. Pt reports he was recently seen in urgent care for GERD and given Protonix which helped his sx, but then he stopped taking and sx returned. ? ? ?Past Medical History:  ?Diagnosis Date  ? Allergy   ? Anxiety   ? GERD (gastroesophageal reflux disease)   ? ? ?Past Surgical History:  ?Procedure Laterality Date  ? SHOULDER SURGERY    ? TONSILLECTOMY    ? ? ?Family History  ?Problem Relation Age of Onset  ? Heart disease Father   ? ? ?Social History  ? ?Socioeconomic History  ? Marital status: Single  ?  Spouse name: Not on file  ? Number of children: Not on file  ? Years of education: Not on file  ? Highest education level: Not on file  ?Occupational History  ? Not on file  ?Tobacco Use  ? Smoking status: Former  ?  Types: Cigarettes  ?  Quit date: 2021  ?  Years since quitting: 2.1  ? Smokeless tobacco: Never  ?Vaping Use  ? Vaping Use: Every day  ?Substance and Sexual Activity  ? Alcohol use: Yes  ? Drug use: Yes  ?  Types: Marijuana  ? Sexual activity: Not on file  ?Other Topics Concern  ? Not on file  ?Social History Narrative  ? Not on file  ? ?Social Determinants of Health  ? ?Financial Resource Strain: Not on file  ?Food Insecurity: Not on file  ?Transportation  Needs: Not on file  ?Physical Activity: Not on file  ?Stress: Not on file  ?Social Connections: Not on file  ?Intimate Partner Violence: Not on file  ? ? ?Objective:  ? ?Today's Vitals: BP 116/74   Pulse 75   Temp 98 ?F (36.7 ?C)   Ht 5\' 10"  (1.778 m)   Wt 171 lb 3.2 oz (77.7 kg)   SpO2 98%   BMI 24.56 kg/m?  ? ?Physical Exam ?Vitals and nursing note reviewed.  ?Constitutional:   ?   General: He is not in acute distress. ?   Appearance: Normal appearance.  ?HENT:  ?   Head: Normocephalic.  ?Cardiovascular:  ?   Rate and Rhythm: Normal rate and regular rhythm.  ?Pulmonary:  ?   Effort: Pulmonary effort is normal.  ?   Breath sounds: Normal breath sounds.  ?Musculoskeletal:     ?   General: Normal range of motion.  ?   Cervical back: Normal range of motion.  ?Skin: ?   General: Skin is warm and dry.  ?Neurological:  ?   Mental Status: He is alert and oriented to person, place, and time.  ?Psychiatric:     ?   Mood and Affect: Mood normal.  ? ? ?Assessment & Plan:  ? ?Problem List Items Addressed This  Visit   ? ?  ? Digestive  ? Reflux esophagitis - Primary  ?  New- seen in ER and given Protonix, stopped taking and sx returned. also reports he thinks he has Bulimia and asking if that contributes, advised it can cause esophagitis & worsen reflux, and recommend he get tx for this problem, but he then states he thinks the Protonix is taking care of the problem as he doesn't gag or need to vomit. Explained that Bulimia is an eating disorder, not a reflux sx. Pt VU. Advised on taking Protonix bid for 2 weeks, then reduce to daily, f/u in 3 mos. ?  ?  ? Relevant Medications  ? pantoprazole (PROTONIX) 20 MG tablet  ? ? ?Outpatient Encounter Medications as of 05/07/2021  ?Medication Sig  ? [DISCONTINUED] pantoprazole (PROTONIX) 40 MG tablet Take 1 tablet (40 mg total) by mouth daily. Separate dose of pantoprazole and sulcal fate by 2 hours.  ? pantoprazole (PROTONIX) 20 MG tablet Take 1 tablet (20 mg total) by mouth 2  (two) times daily. After 2 weeks, try to reduce to 1 pill daily if no symptoms.  ? [DISCONTINUED] acetaminophen (TYLENOL) 500 MG tablet Take 1,000 mg by mouth daily as needed (pain/fever/sore throat).  ? [DISCONTINUED] hydrOXYzine (ATARAX/VISTARIL) 25 MG tablet Take 1 tablet (25 mg total) by mouth every 6 (six) hours.  ? [DISCONTINUED] ondansetron (ZOFRAN) 4 MG tablet Take 1 tablet (4 mg total) by mouth every 6 (six) hours.  ? [DISCONTINUED] Phenylephrine-APAP-guaiFENesin 10-650-400 MG/20ML LIQD Take 20 mLs by mouth 4 (four) times daily as needed (cough, cold).  ? [DISCONTINUED] potassium chloride SA (K-DUR,KLOR-CON) 20 MEQ tablet Take 1 tablet (20 mEq total) by mouth 2 (two) times daily for 5 days.  ? [DISCONTINUED] sucralfate (CARAFATE) 1 g tablet Take 1 tablet 4 times daily, take 30 minutes prior to meals and prior to bedtime.  Separate doses of sucralfate from daily dose of Protonix by 2 hours.  ? ?No facility-administered encounter medications on file as of 05/07/2021.  ? ? ?Follow-up: Return in about 3 months (around 08/07/2021) for GERD, med refill.  ? ?Dulce Sellar, NP ?

## 2021-05-07 NOTE — Assessment & Plan Note (Addendum)
New- seen in ER and given Protonix, stopped taking and sx returned. also reports he thinks he has Bulimia and asking if that contributes, advised it can cause esophagitis & worsen reflux, and recommend he get tx for this problem, but he then states he thinks the Protonix is taking care of the problem as he doesn't gag or need to vomit. Explained that Bulimia is an eating disorder, not a reflux sx. Pt VU. Advised on taking Protonix bid for 2 weeks, then reduce to daily, f/u in 3 mos. ?
